# Patient Record
Sex: Male | Born: 1970 | Race: White | Hispanic: No | Marital: Single | Smoking: Current every day smoker
Health system: Southern US, Community
[De-identification: ages and names within clinical notes are randomized; demographics above are authoritative.]

## PROBLEM LIST (undated history)

## (undated) DIAGNOSIS — I10 Essential (primary) hypertension: Secondary | ICD-10-CM

---

## 2001-07-01 ENCOUNTER — Emergency Department (HOSPITAL_COMMUNITY): Admission: EM | Admit: 2001-07-01 | Discharge: 2001-07-01 | Payer: Self-pay | Admitting: Emergency Medicine

## 2009-06-30 ENCOUNTER — Emergency Department (HOSPITAL_COMMUNITY): Admission: EM | Admit: 2009-06-30 | Discharge: 2009-06-30 | Payer: Self-pay | Admitting: Emergency Medicine

## 2017-01-07 ENCOUNTER — Emergency Department (HOSPITAL_COMMUNITY)
Admission: EM | Admit: 2017-01-07 | Discharge: 2017-01-07 | Disposition: A | Discharge status: Home / Self Care | Payer: Self-pay | Attending: Emergency Medicine | Admitting: Emergency Medicine

## 2017-01-07 ENCOUNTER — Encounter (HOSPITAL_COMMUNITY): Payer: Self-pay | Admitting: Emergency Medicine

## 2017-01-07 DIAGNOSIS — T401X1A Poisoning by heroin, accidental (unintentional), initial encounter: Secondary | ICD-10-CM | POA: Insufficient documentation

## 2017-01-07 DIAGNOSIS — F17210 Nicotine dependence, cigarettes, uncomplicated: Secondary | ICD-10-CM | POA: Insufficient documentation

## 2017-01-07 DIAGNOSIS — I10 Essential (primary) hypertension: Secondary | ICD-10-CM | POA: Insufficient documentation

## 2017-01-07 HISTORY — DX: Essential (primary) hypertension: I10

## 2017-01-07 LAB — CBG MONITORING, ED: Glucose-Capillary: 123 mg/dL — ABNORMAL HIGH (ref 65–99)

## 2017-01-07 MED ORDER — NALOXONE HCL 4 MG/0.1ML NA LIQD: NASAL | 0 refills | Status: AC
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
## 2017-01-07 NOTE — ED Notes (Signed)
Pt's wife at bedside.

## 2017-11-15 ENCOUNTER — Observation Stay (HOSPITAL_COMMUNITY)
Admission: EM | Admit: 2017-11-15 | Discharge: 2017-11-16 | Disposition: A | Discharge status: Home / Self Care | Payer: No Typology Code available for payment source | Attending: Neurosurgery | Admitting: Neurosurgery

## 2017-11-15 ENCOUNTER — Encounter (HOSPITAL_COMMUNITY): Payer: Self-pay

## 2017-11-15 ENCOUNTER — Other Ambulatory Visit: Payer: Self-pay

## 2017-11-15 ENCOUNTER — Emergency Department (HOSPITAL_COMMUNITY): Payer: No Typology Code available for payment source

## 2017-11-15 DIAGNOSIS — Y92410 Unspecified street and highway as the place of occurrence of the external cause: Secondary | ICD-10-CM | POA: Diagnosis not present

## 2017-11-15 DIAGNOSIS — Y999 Unspecified external cause status: Secondary | ICD-10-CM | POA: Diagnosis not present

## 2017-11-15 DIAGNOSIS — Y9389 Activity, other specified: Secondary | ICD-10-CM | POA: Insufficient documentation

## 2017-11-15 DIAGNOSIS — S065X9A Traumatic subdural hemorrhage with loss of consciousness of unspecified duration, initial encounter: Principal | ICD-10-CM | POA: Insufficient documentation

## 2017-11-15 DIAGNOSIS — S0990XA Unspecified injury of head, initial encounter: Secondary | ICD-10-CM | POA: Diagnosis present

## 2017-11-15 DIAGNOSIS — S065XAA Traumatic subdural hemorrhage with loss of consciousness status unknown, initial encounter: Secondary | ICD-10-CM | POA: Diagnosis present

## 2017-11-15 LAB — COMPREHENSIVE METABOLIC PANEL
ALBUMIN: 3.9 g/dL (ref 3.5–5.0)
ALK PHOS: 77 U/L (ref 38–126)
ALT: 32 U/L (ref 17–63)
ANION GAP: 11 (ref 5–15)
AST: 29 U/L (ref 15–41)
BUN: 12 mg/dL (ref 6–20)
CALCIUM: 8.9 mg/dL (ref 8.9–10.3)
CO2: 22 mmol/L (ref 22–32)
Chloride: 102 mmol/L (ref 101–111)
Creatinine, Ser: 1.18 mg/dL (ref 0.61–1.24)
GFR calc Af Amer: 42 mL/min — ABNORMAL LOW (ref >60)
GFR calc non Af Amer: 36 mL/min — ABNORMAL LOW (ref >60)
GLUCOSE: 202 mg/dL — AB (ref 65–99)
POTASSIUM: 3.6 mmol/L (ref 3.5–5.1)
SODIUM: 135 mmol/L (ref 135–145)
Total Bilirubin: 0.4 mg/dL (ref 0.3–1.2)
Total Protein: 7 g/dL (ref 6.5–8.1)

## 2017-11-15 LAB — RAPID URINE DRUG SCREEN, HOSP PERFORMED
Amphetamines: NOT DETECTED
BENZODIAZEPINES: NOT DETECTED
Barbiturates: NOT DETECTED
COCAINE: NOT DETECTED
Opiates: POSITIVE — AB
Tetrahydrocannabinol: NOT DETECTED

## 2017-11-15 LAB — CBC WITH DIFFERENTIAL/PLATELET
BASOS ABS: 0 10*3/uL (ref 0.0–0.1)
BASOS PCT: 0 %
EOS ABS: 0.2 10*3/uL (ref 0.0–0.7)
Eosinophils Relative: 2 %
HCT: 46.9 % (ref 39.0–52.0)
HEMOGLOBIN: 16.4 g/dL (ref 13.0–17.0)
Lymphocytes Relative: 21 %
Lymphs Abs: 1.9 10*3/uL (ref 0.7–4.0)
MCH: 32.3 pg (ref 26.0–34.0)
MCHC: 35 g/dL (ref 30.0–36.0)
MCV: 92.3 fL (ref 78.0–100.0)
MONOS PCT: 4 %
Monocytes Absolute: 0.3 10*3/uL (ref 0.1–1.0)
NEUTROS ABS: 6.6 10*3/uL (ref 1.7–7.7)
NEUTROS PCT: 73 %
Platelets: 206 10*3/uL (ref 150–400)
RBC: 5.08 MIL/uL (ref 4.22–5.81)
RDW: 13.4 % (ref 11.5–15.5)
WBC: 9.1 10*3/uL (ref 4.0–10.5)

## 2017-11-15 LAB — TROPONIN I: Troponin I: <0.03 ng/mL (ref <0.03)

## 2017-11-15 LAB — ETHANOL

## 2017-11-15 MED ORDER — SODIUM CHLORIDE 0.9 % IV SOLN
INTRAVENOUS | Status: DC
  Administered 2017-11-15: via INTRAVENOUS

## 2017-11-15 MED ORDER — ACETAMINOPHEN 325 MG PO TABS
650.0000 mg | ORAL_TABLET | Freq: Four times a day (QID) | ORAL | Status: DC | PRN
  Administered 2017-11-15: 650 mg via ORAL
  Filled 2017-11-15: qty 2

## 2017-11-15 MED ORDER — SODIUM CHLORIDE 0.9 % IV BOLUS (SEPSIS)
1000.0000 mL | Freq: Once | INTRAVENOUS | Status: AC
  Administered 2017-11-15: 1000 mL via INTRAVENOUS

## 2017-11-15 MED ORDER — ONDANSETRON HCL 4 MG/2ML IJ SOLN: 4.0000 mg | Freq: Four times a day (QID) | INTRAMUSCULAR | Status: DC | PRN

## 2017-11-15 MED ORDER — TRAMADOL HCL 50 MG PO TABS
50.0000 mg | ORAL_TABLET | Freq: Four times a day (QID) | ORAL | Status: DC | PRN
  Administered 2017-11-16: 50 mg via ORAL
  Filled 2017-11-15: qty 1

## 2017-11-15 MED ORDER — SODIUM CHLORIDE 0.9 % IV SOLN: INTRAVENOUS | Status: DC

## 2017-11-15 MED ORDER — ACETAMINOPHEN 650 MG RE SUPP: 650.0000 mg | Freq: Four times a day (QID) | RECTAL | Status: DC | PRN

## 2017-11-15 MED ORDER — ONDANSETRON HCL 4 MG PO TABS: 4.0000 mg | ORAL_TABLET | Freq: Four times a day (QID) | ORAL | Status: DC | PRN
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
## 2017-11-15 NOTE — Progress Notes (Signed)
   11/15/17 1841  Clinical Encounter Type  Visited With Patient  Visit Type Trauma  Consult/Referral To Chaplain  Spiritual Encounters  Spiritual Needs (none identified)  Stress Factors  Patient Stress Factors Health changes  Chaplain spoke with the PT.  PT did not want any next of kin notified at this time.  PT was being taken to CT.  Chaplain will follow up
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
## 2017-11-15 NOTE — ED Notes (Signed)
Pt taken to CT.
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
## 2017-11-16 ENCOUNTER — Encounter (HOSPITAL_COMMUNITY): Payer: Self-pay | Admitting: *Deleted

## 2017-11-16 ENCOUNTER — Observation Stay (HOSPITAL_COMMUNITY): Payer: No Typology Code available for payment source

## 2017-11-16 NOTE — Progress Notes (Signed)
Patient discharged home. Discharge instructions were reviewed with patient. Patient verbalized understanding.  
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
## 2017-11-17 ENCOUNTER — Encounter (HOSPITAL_COMMUNITY): Payer: Self-pay | Admitting: Emergency Medicine
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
